# Patient Record
Sex: Female | Born: 1935 | Race: White | Hispanic: No | Marital: Single
Health system: Southern US, Community
[De-identification: ages and names within clinical notes are randomized; demographics above are authoritative.]

---

## 2001-06-05 ENCOUNTER — Other Ambulatory Visit: Admission: RE | Admit: 2001-06-05 | Discharge: 2001-06-05 | Payer: Self-pay | Admitting: Family Medicine

## 2001-06-11 ENCOUNTER — Encounter: Payer: Self-pay | Admitting: Family Medicine

## 2001-06-11 ENCOUNTER — Encounter: Admission: RE | Admit: 2001-06-11 | Discharge: 2001-06-11 | Payer: Self-pay | Admitting: Family Medicine

## 2001-06-13 ENCOUNTER — Ambulatory Visit (HOSPITAL_COMMUNITY): Admission: RE | Admit: 2001-06-13 | Discharge: 2001-06-13 | Payer: Self-pay | Admitting: Oral & Maxillofacial Surgery

## 2001-06-13 ENCOUNTER — Encounter: Payer: Self-pay | Admitting: Oral & Maxillofacial Surgery

## 2001-06-15 ENCOUNTER — Encounter: Admission: RE | Admit: 2001-06-15 | Discharge: 2001-06-15 | Payer: Self-pay | Admitting: Family Medicine

## 2001-06-15 ENCOUNTER — Encounter: Payer: Self-pay | Admitting: Family Medicine

## 2004-06-12 ENCOUNTER — Encounter: Payer: Self-pay | Admitting: Internal Medicine

## 2004-07-13 ENCOUNTER — Encounter: Payer: Self-pay | Admitting: Internal Medicine

## 2004-08-12 ENCOUNTER — Encounter: Payer: Self-pay | Admitting: Internal Medicine

## 2004-09-12 ENCOUNTER — Encounter: Payer: Self-pay | Admitting: Internal Medicine

## 2004-10-13 ENCOUNTER — Encounter: Payer: Self-pay | Admitting: Internal Medicine

## 2004-11-10 ENCOUNTER — Encounter: Payer: Self-pay | Admitting: Internal Medicine

## 2004-12-11 ENCOUNTER — Encounter: Payer: Self-pay | Admitting: Internal Medicine

## 2005-01-10 ENCOUNTER — Encounter: Payer: Self-pay | Admitting: Internal Medicine

## 2005-02-10 ENCOUNTER — Encounter: Payer: Self-pay | Admitting: Internal Medicine

## 2005-03-12 ENCOUNTER — Encounter: Payer: Self-pay | Admitting: Internal Medicine

## 2005-04-12 ENCOUNTER — Encounter: Payer: Self-pay | Admitting: Internal Medicine

## 2005-05-13 ENCOUNTER — Encounter: Payer: Self-pay | Admitting: Internal Medicine

## 2005-06-12 ENCOUNTER — Encounter: Payer: Self-pay | Admitting: Internal Medicine

## 2005-07-13 ENCOUNTER — Encounter: Payer: Self-pay | Admitting: Internal Medicine

## 2005-08-12 ENCOUNTER — Encounter: Payer: Self-pay | Admitting: Internal Medicine

## 2005-09-12 ENCOUNTER — Encounter: Payer: Self-pay | Admitting: Internal Medicine

## 2005-10-13 ENCOUNTER — Encounter: Payer: Self-pay | Admitting: Internal Medicine

## 2005-11-10 ENCOUNTER — Encounter: Payer: Self-pay | Admitting: Internal Medicine

## 2005-12-11 ENCOUNTER — Encounter: Payer: Self-pay | Admitting: Internal Medicine

## 2006-01-10 ENCOUNTER — Encounter: Payer: Self-pay | Admitting: Internal Medicine

## 2006-02-10 ENCOUNTER — Encounter: Payer: Self-pay | Admitting: Internal Medicine

## 2006-03-12 ENCOUNTER — Encounter: Payer: Self-pay | Admitting: Internal Medicine

## 2006-04-12 ENCOUNTER — Encounter: Payer: Self-pay | Admitting: Internal Medicine

## 2006-05-13 ENCOUNTER — Encounter: Payer: Self-pay | Admitting: Internal Medicine

## 2006-06-12 ENCOUNTER — Encounter: Payer: Self-pay | Admitting: Internal Medicine

## 2006-07-13 ENCOUNTER — Encounter: Payer: Self-pay | Admitting: Internal Medicine

## 2013-09-26 ENCOUNTER — Inpatient Hospital Stay: Payer: Self-pay | Admitting: Internal Medicine

## 2013-09-26 LAB — HEPATIC FUNCTION PANEL A (ARMC)
Albumin: 3.8 g/dL (ref 3.4–5.0)
Alkaline Phosphatase: 79 U/L
Bilirubin, Direct: 0.1 mg/dL (ref 0.00–0.20)
Bilirubin,Total: 0.5 mg/dL (ref 0.2–1.0)
SGOT(AST): 22 U/L (ref 15–37)
SGPT (ALT): 29 U/L (ref 12–78)
Total Protein: 7.2 g/dL (ref 6.4–8.2)

## 2013-09-26 LAB — CBC
HCT: 42.9 % (ref 35.0–47.0)
HGB: 14.3 g/dL (ref 12.0–16.0)
MCH: 31.1 pg (ref 26.0–34.0)
MCHC: 33.4 g/dL (ref 32.0–36.0)
MCV: 93 fL (ref 80–100)
Platelet: 161 10*3/uL (ref 150–440)
RBC: 4.61 10*6/uL (ref 3.80–5.20)
RDW: 13.4 % (ref 11.5–14.5)
WBC: 5.8 10*3/uL (ref 3.6–11.0)

## 2013-09-26 LAB — TROPONIN I
Troponin-I: <0.02 ng/mL
Troponin-I: 0.33 ng/mL — ABNORMAL HIGH

## 2013-09-26 LAB — BASIC METABOLIC PANEL
Anion Gap: 3 — ABNORMAL LOW (ref 7–16)
BUN: 19 mg/dL — ABNORMAL HIGH (ref 7–18)
Calcium, Total: 8.8 mg/dL (ref 8.5–10.1)
Chloride: 102 mmol/L (ref 98–107)
Co2: 32 mmol/L (ref 21–32)
Creatinine: 0.7 mg/dL (ref 0.60–1.30)
EGFR (African American): >60
EGFR (Non-African Amer.): >60
Glucose: 112 mg/dL — ABNORMAL HIGH (ref 65–99)
Osmolality: 277 (ref 275–301)
Potassium: 3.5 mmol/L (ref 3.5–5.1)
Sodium: 137 mmol/L (ref 136–145)

## 2013-09-26 LAB — CK-MB: CK-MB: 3.2 ng/mL (ref 0.5–3.6)

## 2013-09-26 LAB — PROTIME-INR
INR: 1.7
Prothrombin Time: 19.5 secs — ABNORMAL HIGH (ref 11.5–14.7)

## 2013-09-26 LAB — LIPASE, BLOOD: Lipase: 142 U/L (ref 73–393)

## 2013-09-27 LAB — CBC WITH DIFFERENTIAL/PLATELET
BASOS PCT: 0.9 %
Basophil #: 0.1 10*3/uL (ref 0.0–0.1)
Eosinophil #: 0.3 10*3/uL (ref 0.0–0.7)
Eosinophil %: 4.4 %
HCT: 40.1 % (ref 35.0–47.0)
HGB: 13.6 g/dL (ref 12.0–16.0)
LYMPHS PCT: 39.1 %
Lymphocyte #: 2.7 10*3/uL (ref 1.0–3.6)
MCH: 31.4 pg (ref 26.0–34.0)
MCHC: 33.9 g/dL (ref 32.0–36.0)
MCV: 92 fL (ref 80–100)
MONOS PCT: 9.6 %
Monocyte #: 0.7 x10 3/mm (ref 0.2–0.9)
Neutrophil #: 3.2 10*3/uL (ref 1.4–6.5)
Neutrophil %: 46 %
PLATELETS: 138 10*3/uL — AB (ref 150–440)
RBC: 4.34 10*6/uL (ref 3.80–5.20)
RDW: 13.4 % (ref 11.5–14.5)
WBC: 6.9 10*3/uL (ref 3.6–11.0)

## 2013-09-27 LAB — LIPID PANEL
CHOLESTEROL: 112 mg/dL (ref 0–200)
HDL Cholesterol: 49 mg/dL (ref 40–60)
Ldl Cholesterol, Calc: 41 mg/dL (ref 0–100)
TRIGLYCERIDES: 112 mg/dL (ref 0–200)
VLDL CHOLESTEROL, CALC: 22 mg/dL (ref 5–40)

## 2013-09-27 LAB — TROPONIN I
TROPONIN-I: 5.9 ng/mL — AB
Troponin-I: 6.8 ng/mL — ABNORMAL HIGH

## 2013-09-27 LAB — APTT: Activated PTT: 60.3 secs — ABNORMAL HIGH (ref 23.6–35.9)

## 2013-09-27 LAB — PROTIME-INR
INR: 1.9
PROTHROMBIN TIME: 21.6 s — AB (ref 11.5–14.7)

## 2013-09-27 LAB — CK-MB
CK-MB: 23 ng/mL — AB (ref 0.5–3.6)
CK-MB: 24.2 ng/mL — ABNORMAL HIGH (ref 0.5–3.6)

## 2013-10-08 ENCOUNTER — Ambulatory Visit: Payer: Self-pay | Admitting: Nurse Practitioner

## 2013-10-15 ENCOUNTER — Ambulatory Visit: Payer: Self-pay | Admitting: Nurse Practitioner

## 2013-10-22 ENCOUNTER — Encounter: Payer: Self-pay | Admitting: Cardiovascular Disease

## 2013-11-11 ENCOUNTER — Ambulatory Visit: Payer: Self-pay | Admitting: Nurse Practitioner

## 2013-11-12 LAB — PATHOLOGY REPORT

## 2013-11-13 ENCOUNTER — Encounter: Payer: Self-pay | Admitting: Cardiovascular Disease

## 2013-12-11 ENCOUNTER — Encounter: Payer: Self-pay | Admitting: Cardiovascular Disease

## 2015-01-03 NOTE — Discharge Summary (Signed)
PATIENT NAME:  Tammy, Wong MR#:  [id number] DATE OF BIRTH:  1932/06/25  DATE OF ADMISSION:  09/26/2013 DATE OF DISCHARGE:  09/27/2013.   ADMITTING DIAGNOSIS:  Chest pain.   DISCHARGE DIAGNOSES:  1.  Non-Q-wave myocardial infarction, status post cardiac catheterization on 09/27/2013 by Dr. Adrian Blackwater, a suspected coronary spasm as well as minimal narrowing of obtuse marginal 3 was noted by Cardiology during cardiac catheterization.  2.  A history of hypertension.  3.  Hyperlipidemia with LDL of 41. 4.  Atrial fibrillation on anticoagulation therapy with Coumadin, subtherapeutic Coumadin level of 1.9 on 09/26/2013. An extra dose of Coumadin will be given prior to discharge home.   DISCHARGE CONDITION:  Stable.   DISCHARGE MEDICATIONS: 1.  Simvastatin 40 mg p.o. at bedtime. 2.  Mobic 7.5 mg p.o. daily.  3.  Coumadin 2.5 mg p.o. daily.  4.  Sotalol 160 mg p.o. twice daily.  5.  Norvasc, a new medication, 2.5 mg p.o. daily.  6.  Aspirin 81 mg p.o. daily.  7.  Ranitidine 150 mg p.o. twice daily. The patient was advised to stop hydrochlorothiazide and potassium supplements unless recommended by primary care physician.   HOME OXYGEN:  None.   DIET:  2 grams salt, low-fat, low-cholesterol, regular consistency.   ACTIVITY LIMITATIONS:  As tolerated.  FOLLOWUP APPOINTMENT:  With primary care physician 2 days after discharge, Dr. Adrian Blackwater in 1 week after discharge.   CONSULTANTS:  Dr. Adrian Blackwater.   RADIOLOGIC STUDIES:  A chest, pa and lateral, 09/26/2013, revealed atelectasis versus mild infiltrate in the right lung base. CT scan of chest with IV contrast, 09/26/2013, was read as negative for pulmonary embolus. No acute disease but emphysema was noted.   Cardiac catheterization, 09/27/2013, by Dr. Adrian Blackwater:  Global left ventricular systolic function was noted to be normal, ejection fraction calculated by contrast ventriculography was 60%, a very small branch of left circumflex  had a high-grade lesion, size of vessel less than 0.5 mm. Otherwise, no significant disease with normal left ventricular ejection fraction. Coronary circulation was right dominant. Mid left main normal, distal LAD angiography showed minor luminal irregularities, third obtuse marginal was 80% stenosis. Proximal RCA angiography showed minor luminal irregularities.   HOSPITAL COURSE:  The patient is a 81 year old Caucasian female with past medical history significant for a history of hypertension as well as hyperlipidemia, who presented to the hospital with complaints of chest pains. Please refer to Dr. Suzanne Boron admission note on 09/26/2013. On arrival to the Emergency Room, her physical examination was unremarkable. The patient's EKG revealed sinus rhythm with third degree AV block, otherwise no acute ST-T changes were noted. The patient's lab data showed a normal BMP, except a mildly elevated BUN of 19. The glucose was 112, otherwise, lipase level was 142, liver enzymes were normal. The first set of troponin was normal at 0.02. However, second set revealed elevation of troponin to 0.30, a third set revealed a troponin elevated to 5.9, and the MB fraction was 23.0. CBC was within normal limits. The patient's pro-time was 19.5, INR was 1.7, elevated D-dimer was found to be at 0.46. The patient underwent CT scanning of her chest and consultation with cardiologist was obtained. Coumadin was placed on hold and the patient was initiated on a heparin IV. She was evaluated by Dr. Adrian Blackwater, who felt the patient would benefit from cardiac catheterization. Cardiac catheterization was performed on 09/27/2013; however, only one very small branch of OM3 was noted, which was 80%  stenosis; however, nonamenable to angioplasty. Otherwise all coronary arteries looked normal and the ejection fraction was found to be normal. Dr. Adrian BlackwaterShaukat Khan felt that the patient's small MI could have been related to ischemia in that particular  branch or coronary spasms. The patient was advised to be started on aspirin therapy apart from her Coumadin as well as Norvasc to hopefully improve her coronary spasms. The patient was initiated on Norvasc upon discharge. In regards to hyperlipidemia, the patient's lipid panel was checked and was found to be completely within normal limits, in fact very good. The patient's LDL was found to be 41, total cholesterol level was 112, triglycerides were 112, and HDL was 49. The patient was advised to continue a low-fat, low-cholesterol diet and simvastatin. For a history of atrial fibrillation, as mentioned above, the patient's Coumadin was placed on hold for cardiac catheterization; however, the patient's Coumadin will be restarted upon discharge. The patient is going to be given one extra dose of 2 mg of Coumadin. She is going to resume her usual doses of 2.5 mg daily Coumadin dosing. It is recommended to follow the patient's Coumadin levels as outpatient and make decisions about advancement or changing her medication doses as needed.   On the day of discharge, she felt satisfactory, did not complain of any significant discomfort. Her vitals were stable with temperature of 98, pulse was in 60s, respiration rate was 16 to 18, blood pressure 146/72. Saturation was 96% on room air at rest.   TIME SPENT:  40 minutes.   ____________________________ Katharina Caperima Noboru Bidinger, MD rv:jm D: 09/27/2013 15:30:16 ET T: 09/27/2013 16:14:57 ET JOB#: [id number]  cc: Katharina Caperima Lashunta Frieden, MD, <Dictator> Primary care physician, unknown Laurier NancyShaukat A. Khan, MD Katharina CaperIMA Jeanell Mangan MD ELECTRONICALLY SIGNED 10/04/2013 14:14

## 2015-01-03 NOTE — H&P (Signed)
PATIENT NAME:  Tammy CoddingtonFLORENCE, Lakishia S MR#:  [id number] DATE OF BIRTH:  08/15/1932  DATE OF ADMISSION:  09/26/2013  PRIMARY DOCTOR: From [place].   EMERGENCY ROOM PHYSICIAN: Dr. Margarita GrizzleWoodruff.   CHIEF COMPLAINT: Chest pain.   HISTORY OF PRESENT ILLNESS: The patient is a 81 year old female patient with history of hypertension, atrial fibrillation, joint pains. Came in because the patient had a little bit of tightness in her chest on the left side. The patient really did not have chest pain as such but feels like a twinge in her muscle on the left side which lasted for over 30 minutes, but she wanted to make sure her EKG is fine, so she came to ER. She said she had a soda that made her belch and then she felt better. Initial troponin was negative, but the next troponin went up to 0.33. Concerning this, she is referred for admission. The patient right now is chest pain-free. Did not have any trouble breathing. No nausea. No vomiting.   The patient is coming from [place], and she is here to help her brother who had knee surgery. The patient has her doctors and cardiologist in [place].    PAST MEDICAL HISTORY: Significant for history of hypertension, atrial fibrillation and also history of joint pains. She also has a history of hyperlipidemia.   SOCIAL HISTORY: The patient never smoked nor drank.   SURGICAL HISTORY: History of right knee surgery.   MEDICATIONS: Simvastatin 40 mg p.o. daily, Kay Ciel 30 mEq p.o. daily, Mobic 7.5 mg p.o. daily, HCTZ 25 mg p.o. daily, Coumadin 2.5 mg p.o. daily, sotalol 160 mg p.o. daily.   The patient says that she has a portable INR machine that she checked. It was about 2 today, but INR today here is 1.7.   ALLERGIES: ON GLUTEN-FREE DIET.   FAMILY HISTORY: Significant for mother had hypertension and history of aneurysm. Father has a history of stroke.   The patient's cardiac cath was 2 to 3 years ago, reportedly was normal.   REVIEW OF SYSTEMS:   CONSTITUTIONAL: Has no fever. No  fatigue. No weakness.  EYES: No blurred vision.  ENT: No tinnitus. No epistaxis. No difficulty swallowing.  RESPIRATORY: No cough. No wheezing.  CARDIOVASCULAR: The patient had some chest tightness on the left side today which subsided. The patient did not take any aspirin or nitro.  GASTROINTESTINAL: No nausea. No vomiting. No abdominal pain.  GENITOURINARY: No dysuria.  ENDOCRINE: No polyuria or nocturia.  HEMATOLOGIC: No anemia.  INTEGUMENTARY: No skin rashes.  MUSCULOSKELETAL:has h/o knee arthritis.  NEUROLOGIC: No numbness or weakness.  PSYCHIATRIC: No anxiety or insomnia.   PHYSICAL EXAMINATION:  GENERAL: The patient is a well-developed, well-nourished female not in distress.   HEAD: Atraumatic, normocephalic.  EYES: Pupils equally reacting to light. Extraocular movements are intact.  NOSE: No turbinate hypertrophy. No erythema.  NECK: Supple. No JVD. No carotid bruit.  RESPIRATIONS: Clear to auscultation. No wheeze. No rales.  CARDIOVASCULAR: S1, S2 regular. No murmurs. The patient has slight chest wall tenderness present on the left side just below the left breast. The patient has no pedal edema. Pedal pulses are intact and femoral pulses intact.  GASTROINTESTINAL: Abdomen is soft, nontender, nondistended. Bowel sounds present.  MUSCULOSKELETAL: Normal gait and station. Able to move all extremities.  SKIN: Inspection is normal.  VASCULAR: Good pedal pulses.  NEUROLOGIC: Cranial nerves II through XII intact. Power 5/5 in upper and lower extremities. DTRs are 2+ bilaterally.   LABORATORY DATA: CT chest  showed no pulmonary emboli and no acute disease. Chest x-ray showed atelectasis versus infiltrate in the right lung base. WBC 5.8, hemoglobin 14.3, , platelets 151. Electrolytes: Sodium 137, potassium 3.5, chloride 102, bicarb 32, BUN 19, creatinine 0.72, glucose 112. Troponin initially 0.02, next one went up to 0.33. The patient's D-dimer is 0.46. Lipase 142. INR 1.7. EKG showed  sinus rhythm with first-degree AV block, 70 beats per minute.   ASSESSMENT AND PLAN:  1. The patient is a 81 year old female patient with atypical features of chest pain. Symptoms are not typical of acute coronary syndrome, but the patient has a history of hypertension, atrial fibrillation, advanced age. The patient will be admitted to the hospitalist service because of her elevated troponins and also the risk factors to rule out for acute coronary syndrome. The patient will get aspirin, and she is already on sotalol for atrial fibrillation. Continue that, and INR is not therapeutic. Continue heparin drip. Obtain cardiology consult for possible cardiac catheterization. Continue nitroglycerin.  2. The patient has hyperlipidemia: Continue simvastatin.  3. History of arthritis: Continue Mobic 7.5 mg p.o. daily.  4. The patient will be admitted to telemetry. The patient's x-ray shows pneumonia, but she has no symptoms of pneumonia, with no white count and no fever. Hold off on antibiotics at this time.  5. She will be on gluten-free diet.   TIME SPENT: About 60 minutes.    ____________________________ Katha Hamming, MD sk:gb D: 09/26/2013 21:12:46 ET T: 09/26/2013 21:31:50 ET JOB#: [id number]  cc: Katha Hamming, MD, <Dictator> Katha Hamming MD ELECTRONICALLY SIGNED 09/27/2013 15:21

## 2015-01-03 NOTE — Consult Note (Signed)
PATIENT NAME:  Tammy CoddingtonFLORENCE, Tammy Wong MR#:  [id number] DATE OF BIRTH:  [birth year]  DATE OF CONSULTATION:  09/27/2013  REFERRING PHYSICIAN:   CONSULTING PHYSICIAN:  Laurier NancyShaukat A. Khan, MD  INDICATION FOR CONSULTATION: Non-STEMI AND chest pain.   HISTORY OF PRESENT ILLNESS: This is a 81 year old white female with a past medical history of atrial fibrillation, hypertension, and arthritis who came into the hospital with chest pain. She said the chest pain was more like twinges in the chest associated with shortness of breath and left arm pain. She had a heart attack last year in [place] where she was getting her knee surgery. At that time, the chest pain was much severe. At this time, she denies any chest pain or shortness of breath.   PAST MEDICAL HISTORY: As mentioned above.   SOCIAL HISTORY: Unremarkable.   FAMILY HISTORY: Positive for coronary artery disease.   ALLERGIES: No known drug allergies.   DIET:  She is on gluten-free diet.  PHYSICAL EXAMINATION: GENERAL: She is alert and oriented x3, in no acute distress.  VITAL SIGNS: Stable.  NECK: No JVD.  LUNGS: Clear.  HEART: Regular rate and rhythm. Normal S1, S2. No audible murmur.  ABDOMEN: Soft, nontender. Positive bowel sounds.  EXTREMITIES: No pedal edema.  NEUROLOGIC: She appears to be intact.   LABORATORY AND DIAGNOSTICS: EKG shows normal sinus rhythm, 70 beats per minute, nonspecific ST-T changes, poor R wave progression.   Troponin is 5.9. MB fraction is 23. Initial troponin was 0.33.   ASSESSMENT AND PLAN: The patient has non-ST-segment elevation myocardial infarction  with chest pain which has resolved right now with history of coronary artery disease and history of myocardial infarction last year. Plan is to do left heart catheterization today. In the meantime, I would continue heparin. Thank you very much for the referral.  ____________________________ Laurier NancyShaukat A. Khan, MD sak:sb D: 09/27/2013 08:26:20 ET T: 09/27/2013 08:35:08  ET JOB#: [id number]  cc: Laurier NancyShaukat A. Khan, MD, <Dictator> Laurier NancySHAUKAT A KHAN MD ELECTRONICALLY SIGNED 09/27/2013 14:26

## 2015-01-03 NOTE — Consult Note (Signed)
PATIENT NAME:  Tammy CoddingtonFLORENCE, Tammy Wong MR#:  [id number] DATE OF BIRTH:  June 07, 1932  DATE OF CONSULTATION:  09/27/2013  CONSULTING PHYSICIAN:  Laurier NancyShaukat A. Jisel Fleet, MD  INDICATION FOR CONSULTATION: Chest pain and SVT.  This is a 81 year old white female with a past medical history of hypertension and supraventricular tachycardia who came into the Emergency Room with tightness in the chest. She said that it was a feeling of tightness in her chest and throat associated with shortness of breath. She was given adenosine twice, 12 mg and 6 mg, and she converted back to sinus rhythm. She denies any chest pain or shortness of breath right now.   PAST MEDICAL HISTORY: History of SVT, hypertension, hyperlipidemia, and diabetes.   SOCIAL HISTORY: She does smoke occasionally.   FAMILY HISTORY: Positive for coronary artery disease.   PHYSICAL EXAMINATION: GENERAL: She is alert, oriented x 3, in no acute distress.  VITAL SIGNS: Stable. Her heart rate right now is 86, respirations 14. She is on the monitor, sinus rhythm.  NECK: No JVD.  LUNGS: Clear.  HEART: Regular rate and rhythm. Normal S1, S2. No audible murmur.  ABDOMEN: Soft, nontender, positive bowel sounds.  EXTREMITIES: No pedal edema.   DIAGNOSTIC DATA: EKG shows normal sinus rhythm. No acute changes. Her troponin was 0.12.   ASSESSMENT AND PLAN: Mildly elevated troponin, status post supraventricular tachycardia. EKG has reverted back to sinus rhythm. We will do 3 serial enzymes for troponin and  CPK and MB fraction to see if there is an increment in the troponin. A mildly elevated troponin may be due to demand ischemia. No need to do cardiac catheterization, but will do outpatient stress testing.   Thank you very much for the referral.   ____________________________ Laurier NancyShaukat A. Sumayya Muha, MD sak:jcm D: 09/27/2013 12:15:21 ET T: 09/27/2013 12:28:28 ET JOB#: [id number]  cc: Laurier NancyShaukat A. Senon Nixon, MD, <Dictator> Laurier NancySHAUKAT A Camillo Quadros MD ELECTRONICALLY SIGNED 10/03/2013  8:42

## 2015-01-19 IMAGING — CT CT ANGIO CHEST
2 of 6 series · 18 of 36 positions shown · IV contrast (APPLIED)
Comparison: PA and lateral chest earlier this same day.

CLINICAL DATA: Chest pain.  History of pulmonary embolus.

EXAM:
CT ANGIOGRAPHY CHEST WITH CONTRAST
TECHNIQUE: Multidetector CT imaging of the chest was performed using the
standard protocol during bolus administration of intravenous
contrast. Multiplanar CT image reconstructions including MIPs were
obtained to evaluate the vascular anatomy.
CONTRAST:  100 cc Isovue 370.

[Series 10: cor pe 2.0 mpr · coronal · 0.51mm/px · 1 of 108 slices shown]
[im 54/108  mediastinal]
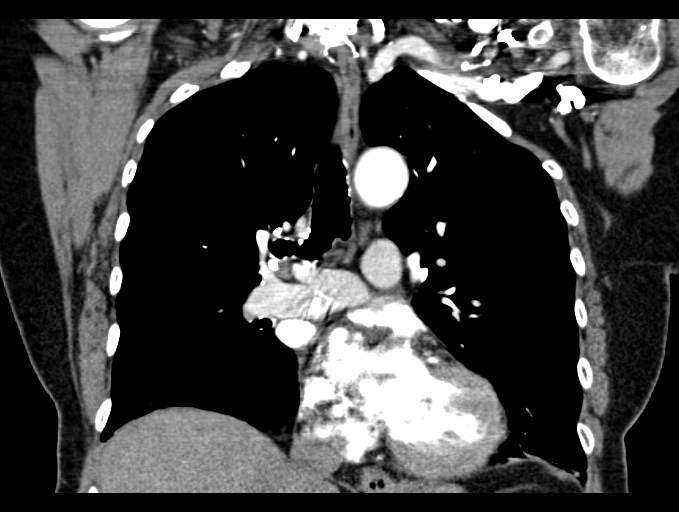

[Series 14: pe 1.0 thins 1 · axial · 0.67mm/px · z∈[+756,+970]mm · 17 of 242 slices shown]
[im 14/242  lung]
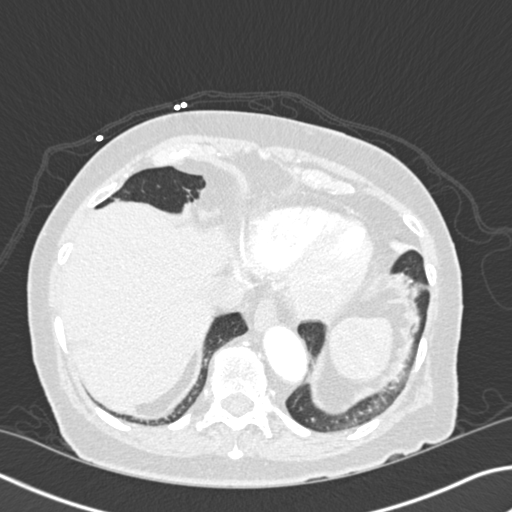
[im 27/242  mediastinal]
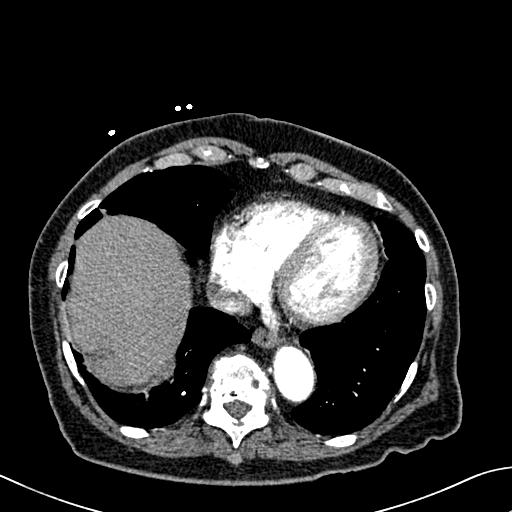
[im 41/242  lung]
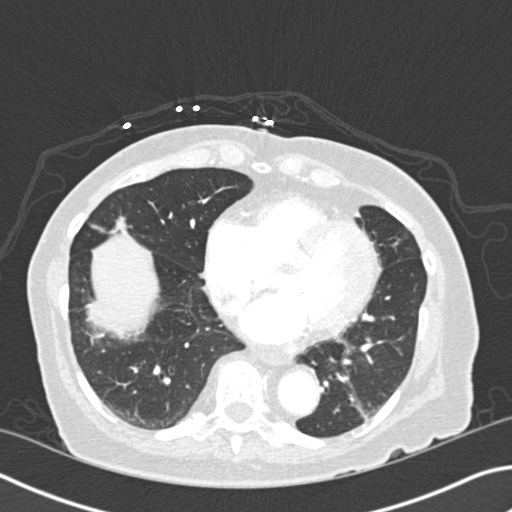
[im 54/242  mediastinal]
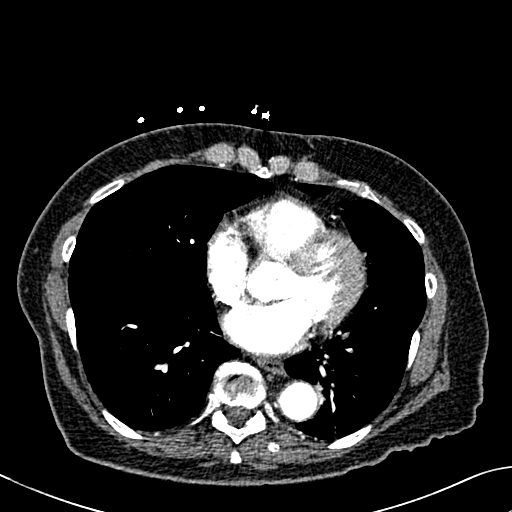
[im 67/242  lung]
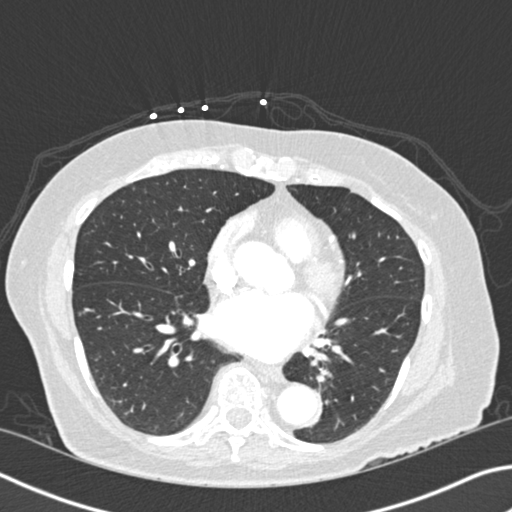
[im 81/242  mediastinal]
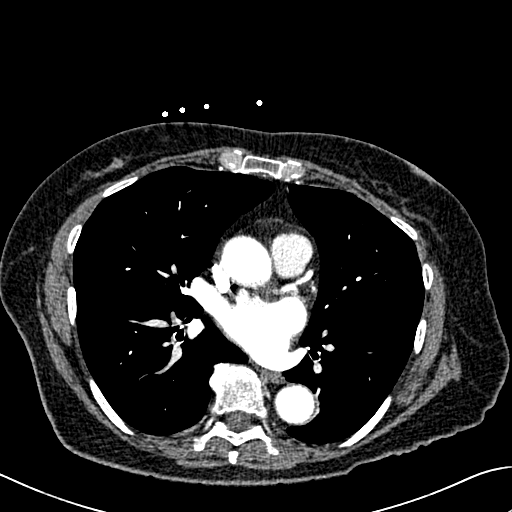
[im 94/242  lung]
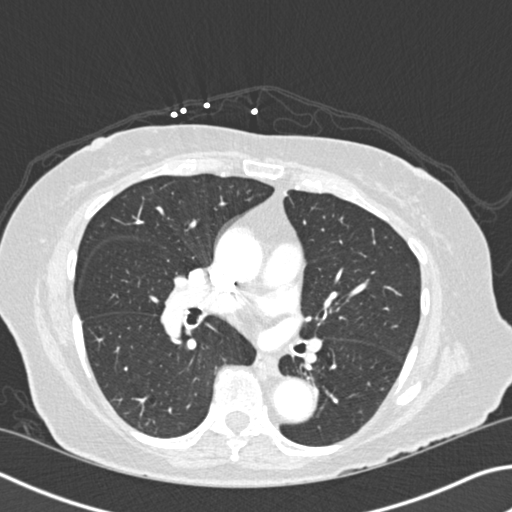
[im 108/242  mediastinal]
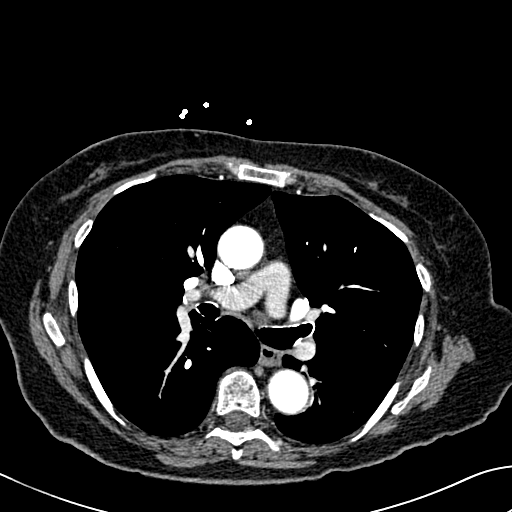
[im 121/242  lung]
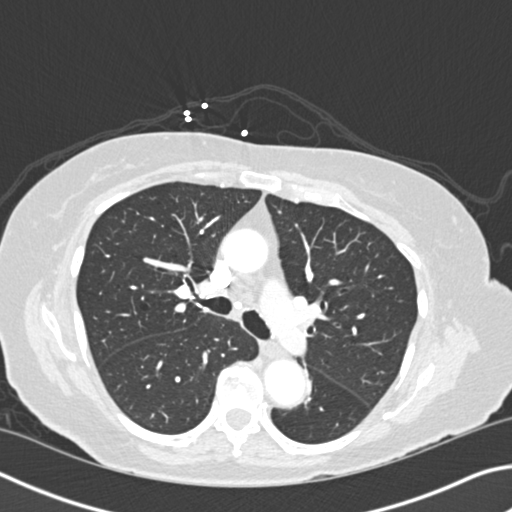
[im 134/242  mediastinal]
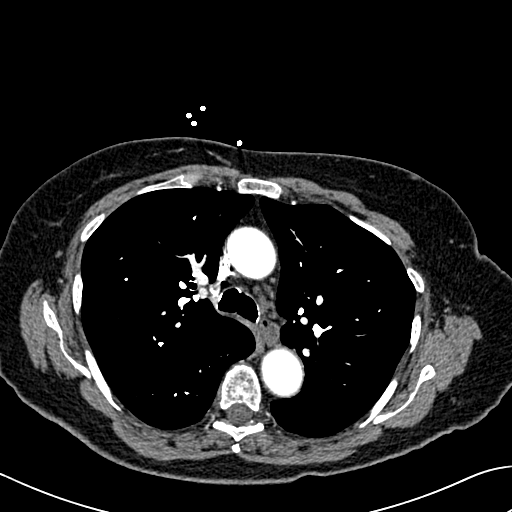
[im 148/242  lung]
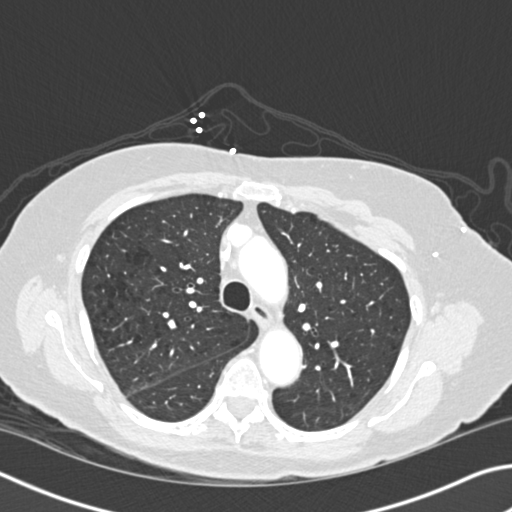
[im 161/242  mediastinal]
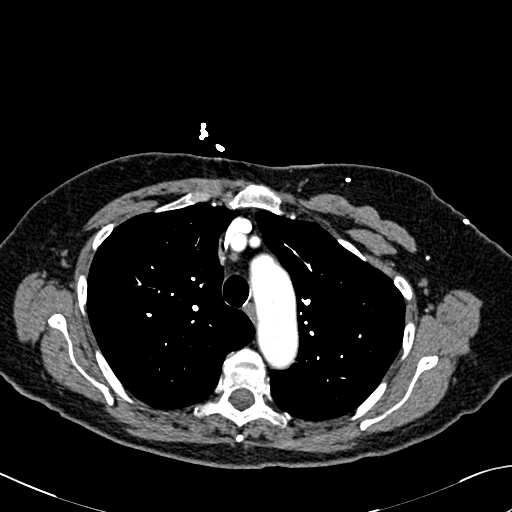
[im 175/242  lung]
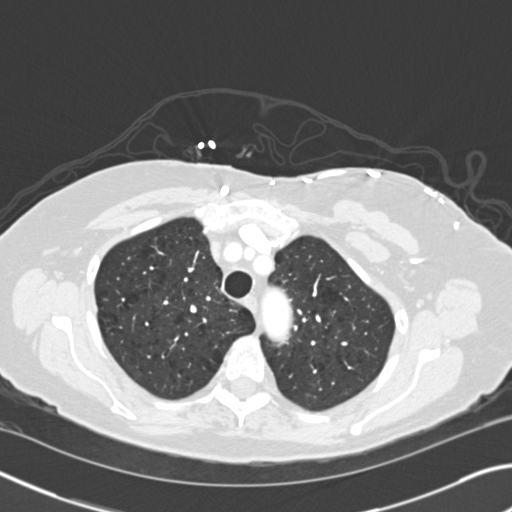
[im 188/242  mediastinal]
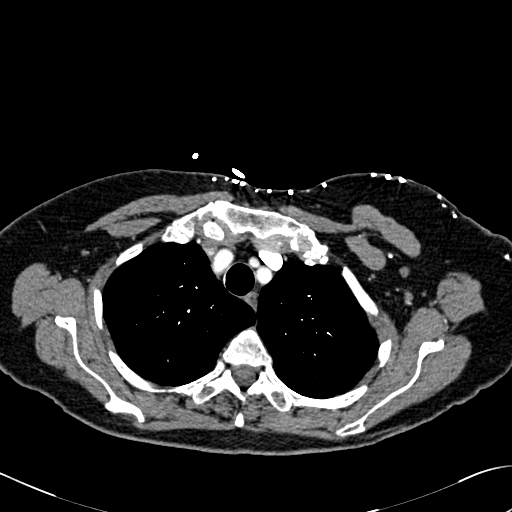
[im 201/242  lung]
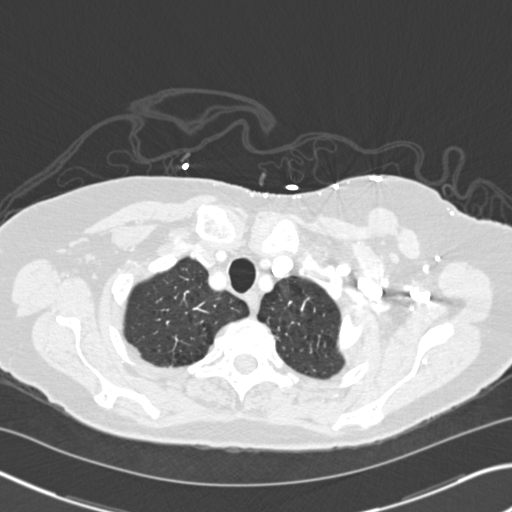
[im 215/242  mediastinal]
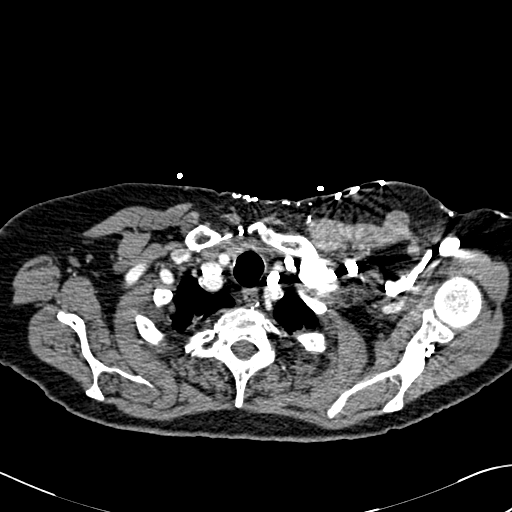
[im 228/242  lung]
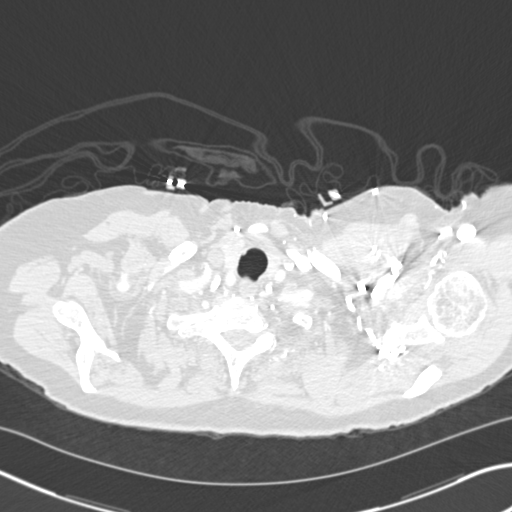

[18 of 36 positions shown; findings below may reference images not displayed]

FINDINGS: Although somewhat limited by bolus timing, pulmonary embolus is
identified. No enlarged hilar or mediastinal lymph nodes are noted.
There are some small calcified mediastinal lymph nodes consistent
with old granulomatous disease. No pleural or pericardial effusion.
Heart size is normal. Lungs demonstrate centrilobular emphysema.
Mild dependent atelectasis is seen. No nodule, mass or consolidative
process is seen.

Incidentally imaged upper abdomen demonstrates a low attenuating
lesion in the right hepatic lobe measuring 1.3 cm compatible with
either a cyst or small hemangioma. Calcifications in the spleen are
consistent with old granulomatous disease. Thoracic spondylosis is
noted. No lytic or sclerotic lesion is identified.

Review of the MIP images confirms the above findings.
IMPRESSION: Negative for pulmonary embolus.  No acute disease.

Emphysema.

## 2015-01-19 IMAGING — CR DG CHEST 2V
1 series · 2 of 2 positions shown · non-contrast
Comparison: None.

CLINICAL DATA: Chest pain.

EXAM:
CHEST  2 VIEW

[Series 1: w chest pa · 0.14mm/px · 2 of 2 slices shown]
[im 1/2]
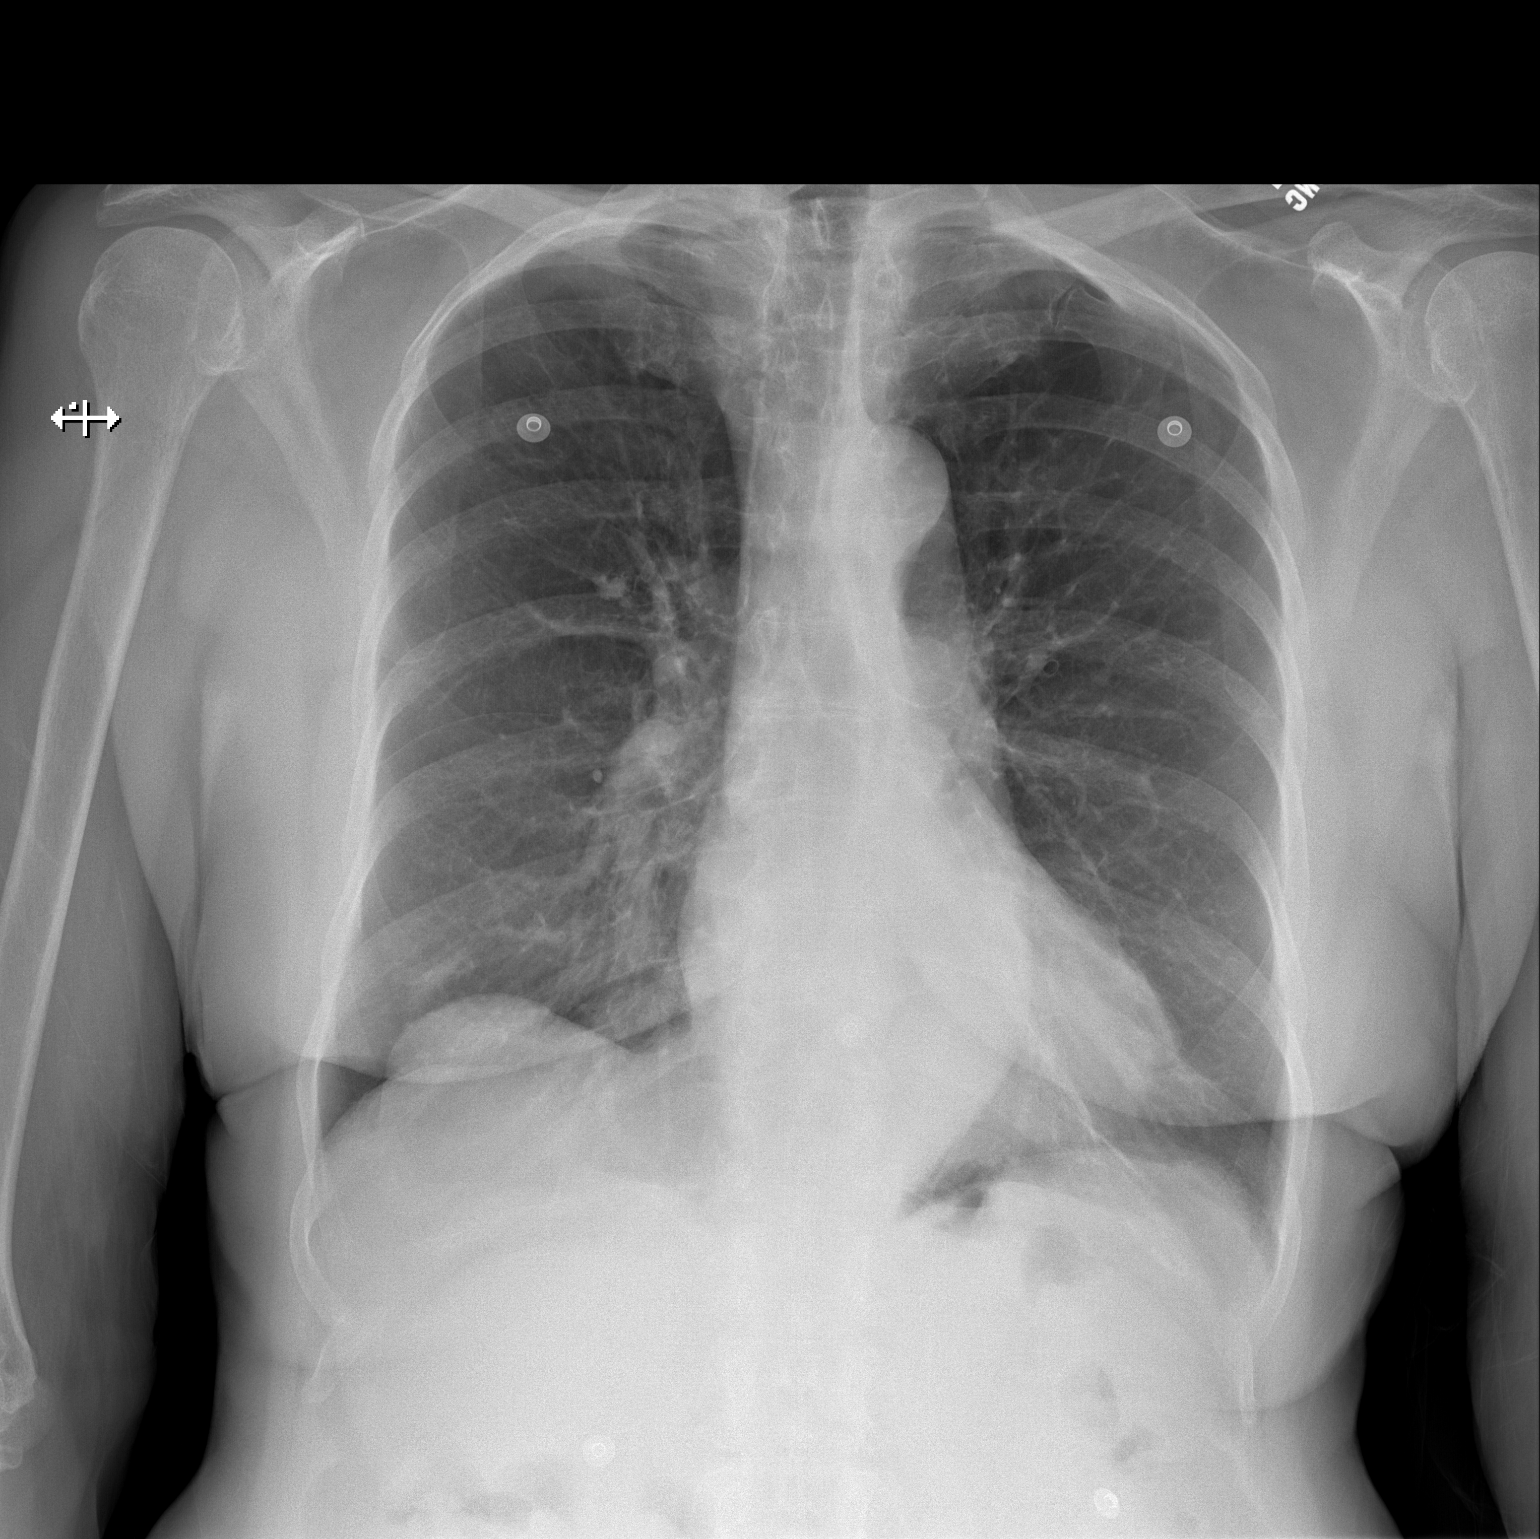
[im 2/2]
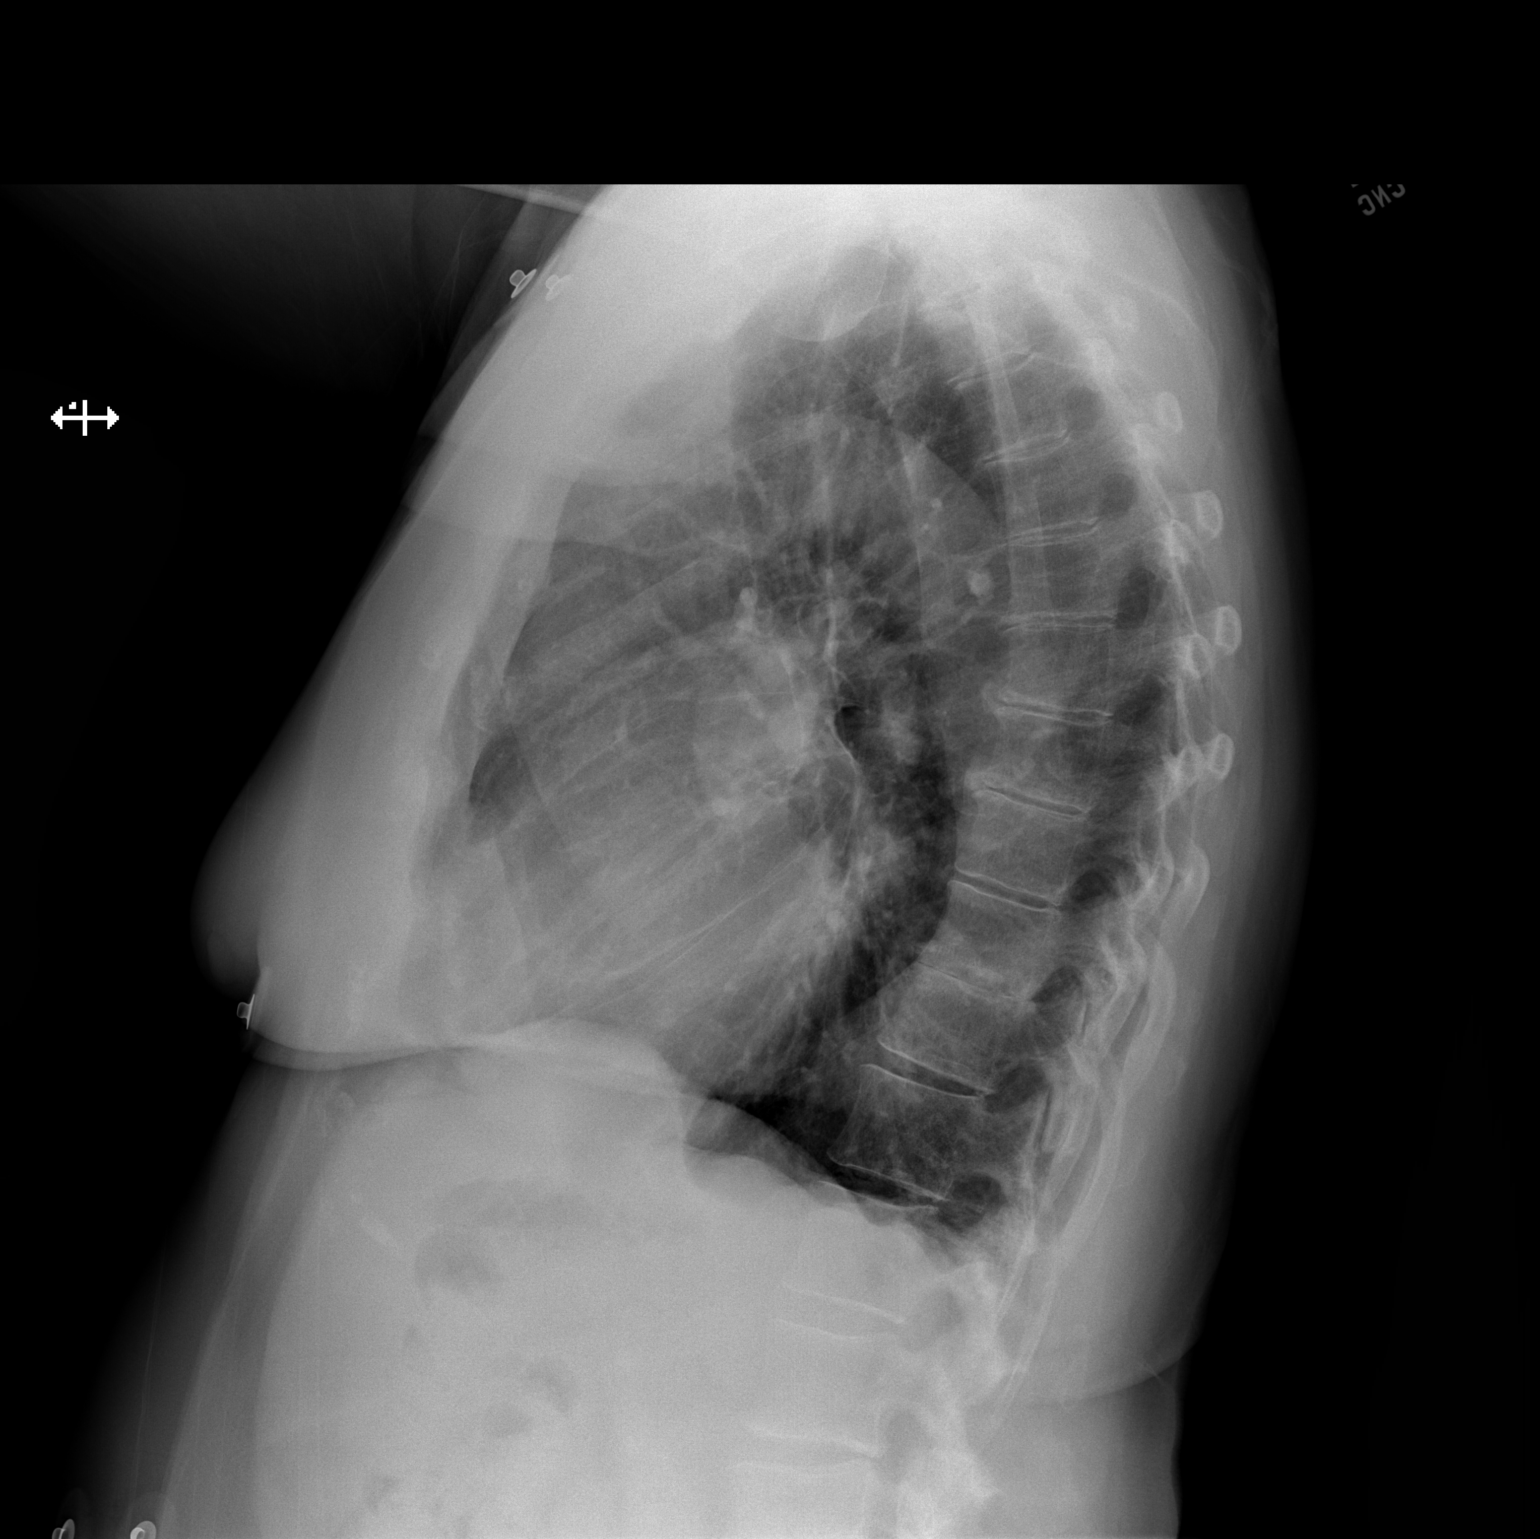

[2 of 2 positions shown; findings below may reference images not displayed]

FINDINGS: The heart size and mediastinal contours are within normal limits.
Minimal area of increased density projects within the right lung
base appreciated on the frontal view. No further focal regions of
consolidation or focal infiltrates are appreciated. The osseous
structures unremarkable.
IMPRESSION: Atelectasis versus mild infiltrate right lung base.
